# Patient Record
Sex: Male | Born: 1965 | Race: Black or African American | Hispanic: No | Marital: Married | Smoking: Former smoker
Health system: Southern US, Community
[De-identification: ages and names within clinical notes are randomized; demographics above are authoritative.]

## PROBLEM LIST (undated history)

## (undated) DIAGNOSIS — K4020 Bilateral inguinal hernia, without obstruction or gangrene, not specified as recurrent: Secondary | ICD-10-CM

## (undated) DIAGNOSIS — I10 Essential (primary) hypertension: Secondary | ICD-10-CM

---
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
## 1999-10-23 DIAGNOSIS — K4020 Bilateral inguinal hernia, without obstruction or gangrene, not specified as recurrent: Secondary | ICD-10-CM

## 1999-10-23 HISTORY — DX: Bilateral inguinal hernia, without obstruction or gangrene, not specified as recurrent: K40.20

## 2019-06-13 ENCOUNTER — Emergency Department: Payer: Self-pay

## 2019-06-13 ENCOUNTER — Emergency Department
Admission: EM | Admit: 2019-06-13 | Discharge: 2019-06-13 | Disposition: A | Discharge status: Home / Self Care | Payer: Self-pay | Attending: Student in an Organized Health Care Education/Training Program | Admitting: Student in an Organized Health Care Education/Training Program

## 2019-06-13 ENCOUNTER — Other Ambulatory Visit: Payer: Self-pay

## 2019-06-13 DIAGNOSIS — R1011 Right upper quadrant pain: Secondary | ICD-10-CM | POA: Insufficient documentation

## 2019-06-13 DIAGNOSIS — R0789 Other chest pain: Secondary | ICD-10-CM | POA: Insufficient documentation

## 2019-06-13 DIAGNOSIS — Z87891 Personal history of nicotine dependence: Secondary | ICD-10-CM | POA: Insufficient documentation

## 2019-06-13 DIAGNOSIS — R079 Chest pain, unspecified: Secondary | ICD-10-CM

## 2019-06-13 DIAGNOSIS — I10 Essential (primary) hypertension: Secondary | ICD-10-CM | POA: Insufficient documentation

## 2019-06-13 HISTORY — DX: Essential (primary) hypertension: I10

## 2019-06-13 LAB — BASIC METABOLIC PANEL
Anion gap: 13 (ref 5–15)
BUN: 14 mg/dL (ref 6–20)
CO2: 20 mmol/L — ABNORMAL LOW (ref 22–32)
Calcium: 9.1 mg/dL (ref 8.9–10.3)
Chloride: 104 mmol/L (ref 98–111)
Creatinine, Ser: 1.35 mg/dL — ABNORMAL HIGH (ref 0.61–1.24)
GFR calc Af Amer: >60 mL/min (ref >60)
GFR calc non Af Amer: >60 mL/min (ref >60)
Glucose, Bld: 108 mg/dL — ABNORMAL HIGH (ref 70–99)
Potassium: 3.1 mmol/L — ABNORMAL LOW (ref 3.5–5.1)
Sodium: 137 mmol/L (ref 135–145)

## 2019-06-13 LAB — CBC
HCT: 40.7 % (ref 39.0–52.0)
Hemoglobin: 13.5 g/dL (ref 13.0–17.0)
MCH: 27.1 pg (ref 26.0–34.0)
MCHC: 33.2 g/dL (ref 30.0–36.0)
MCV: 81.6 fL (ref 80.0–100.0)
Platelets: 277 10*3/uL (ref 150–400)
RBC: 4.99 MIL/uL (ref 4.22–5.81)
RDW: 12.8 % (ref 11.5–15.5)
WBC: 4.9 10*3/uL (ref 4.0–10.5)
nRBC: 0 % (ref 0.0–0.2)

## 2019-06-13 LAB — TROPONIN I (HIGH SENSITIVITY)
Troponin I (High Sensitivity): 7 ng/L (ref <18)
Troponin I (High Sensitivity): 8 ng/L (ref <18)

## 2019-06-13 LAB — HEPATIC FUNCTION PANEL
ALT: 19 U/L (ref 0–44)
AST: 25 U/L (ref 15–41)
Albumin: 4 g/dL (ref 3.5–5.0)
Alkaline Phosphatase: 55 U/L (ref 38–126)
Bilirubin, Direct: 0.1 mg/dL (ref 0.0–0.2)
Indirect Bilirubin: 0.9 mg/dL (ref 0.3–0.9)
Total Bilirubin: 1 mg/dL (ref 0.3–1.2)
Total Protein: 7.7 g/dL (ref 6.5–8.1)

## 2019-06-13 MED ORDER — ONDANSETRON HCL 4 MG/2ML IJ SOLN
4.0000 mg | Freq: Once | INTRAMUSCULAR | Status: AC
  Administered 2019-06-13: 17:00:00 4 mg via INTRAVENOUS
  Filled 2019-06-13: qty 2

## 2019-06-13 MED ORDER — AMLODIPINE BESYLATE 5 MG PO TABS
5.0000 mg | ORAL_TABLET | Freq: Once | ORAL | Status: AC
  Administered 2019-06-13: 19:00:00 5 mg via ORAL
  Filled 2019-06-13: qty 1

## 2019-06-13 MED ORDER — IOHEXOL 350 MG/ML SOLN
75.0000 mL | Freq: Once | INTRAVENOUS | Status: AC | PRN
  Administered 2019-06-13: 75 mL via INTRAVENOUS

## 2019-06-13 MED ORDER — SODIUM CHLORIDE 0.9% FLUSH
3.0000 mL | Freq: Once | INTRAVENOUS | Status: AC
  Administered 2019-06-13: 3 mL via INTRAVENOUS

## 2019-06-13 MED ORDER — CLONIDINE HCL 0.1 MG PO TABS
0.2000 mg | ORAL_TABLET | Freq: Once | ORAL | Status: DC
  Filled 2019-06-13: qty 2

## 2019-06-13 MED ORDER — NITROGLYCERIN 0.4 MG SL SUBL: 0.4000 mg | SUBLINGUAL_TABLET | SUBLINGUAL | Status: DC | PRN

## 2019-06-13 MED ORDER — MORPHINE SULFATE (PF) 4 MG/ML IV SOLN: 4.0000 mg | INTRAVENOUS | Status: DC | PRN

## 2019-06-13 NOTE — ED Notes (Signed)
FIRST NURSE NOTE:  Pt here via EMS with reports of chest pain, c/o weakness and chest heaviness. Per EMS pt had to be drug from the 18 wheeler.  Pt given 1 spray NTG and 324 ASA prior to arrival.

## 2019-06-13 NOTE — ED Triage Notes (Signed)
Driving down road, chest dot tight, arm stiff, weakness. Pt pulled off road reports getting SOB, dizzy, and weak. Pt states laid down at rest area and called EMS. Pt a&o x 4. Pt states pain has eased up but 6/10. Pt states still feels SOB at this time. RR 20 with even, unlabored respirations.

## 2019-06-13 NOTE — ED Notes (Signed)
Pt states he didn't take his norvasc or omeprazole today and requesting water to take them both. Pt's bp 170/100. Doctor made aware (dr Jacqualine Code)

## 2019-06-13 NOTE — Discharge Instructions (Addendum)
Follow up with your PCP and Cardiology.  Seek medical care sooner if you develop any chest pain, shortness of breath or for any additional questions or concerns.

## 2019-06-13 NOTE — ED Provider Notes (Signed)
[facility] Emergency Department Provider Note    First MD Initiated Contact with Patient 06/13/19 1557     (approximate)  I have reviewed the triage vital signs and the nursing notes.   HISTORY  Chief Complaint Chest Pain    HPI Goebel Hellums is a 53 y.o. male presents the ER for evaluation of midsternal pain and pressure that occurred right around 1:00 while he was driving.  States the pain lasted roughly 1/2 hours was associated with diaphoresis and had an episode where his left arm was cramping up with tightness.  He is never had pain like that before.  Did feel he became clammy and diaphoretic.  Was having pain with deep inspiration.  Has never had symptoms like this before.  He pulled over as he was driving for work.  EMS was called.  Was given nitro with some improvement in his symptoms.  Denies any nausea or vomiting.  No abdominal pain.  Does have a history hypertension.    Past Medical History:  Diagnosis Date  . Hernia, inguinal, bilateral 2001  . Hypertension    History reviewed. No pertinent family history. History reviewed. No pertinent surgical history. There are no active problems to display for this patient.     Prior to Admission medications   Medication Sig Start Date End Date Taking? Authorizing Provider  amLODipine (NORVASC) 5 MG tablet Take 5 mg by mouth daily.   Yes [provider]  omeprazole (PRILOSEC) 40 MG capsule Take 40 mg by mouth daily.   Yes [provider]    Allergies Patient has no allergy information on record.    Social History Social History   Tobacco Use  . Smoking status: Former Research scientist (life sciences)  . Smokeless tobacco: Never Used  Substance Use Topics  . Alcohol use: Not Currently  . Drug use: Never    Review of Systems Patient denies headaches, rhinorrhea, blurry vision, numbness, shortness of breath, chest pain, edema, cough, abdominal pain, nausea, vomiting, diarrhea, dysuria, fevers,  rashes or hallucinations unless otherwise stated above in HPI. ____________________________________________   PHYSICAL EXAM:  VITAL SIGNS: Vitals:   06/13/19 1930 06/13/19 2000  BP: (!) 159/95 (!) 162/97  Pulse: 61 67  Resp: 17 (!) 22  Temp:    SpO2: 93% 97%    Constitutional: Alert and oriented.  Eyes: Conjunctivae are normal.  Head: Atraumatic. Nose: No congestion/rhinnorhea. Mouth/Throat: Mucous membranes are moist.   Neck: No stridor. Painless ROM.  Cardiovascular: Normal rate, regular rhythm. Grossly normal heart sounds.  Good peripheral circulation. Respiratory: Normal respiratory effort.  No retractions. Lungs CTAB. Gastrointestinal: Soft and nontender. No distention. No abdominal bruits. No CVA tenderness. Genitourinary:  Musculoskeletal: No lower extremity tenderness nor edema.  No joint effusions. Neurologic:  Normal speech and language. No gross focal neurologic deficits are appreciated. No facial droop Skin:  Skin is warm, dry and intact. No rash noted. Psychiatric: Mood and affect are normal. Speech and behavior are normal.  ____________________________________________   LABS (all labs ordered are listed, but only abnormal results are displayed)  Results for orders placed or performed during the hospital encounter of 06/13/19 (from the past 24 hour(s))  Basic metabolic panel     Status: Abnormal   Collection Time: 06/13/19  2:22 PM  Result Value Ref Range   Sodium 137 135 - 145 mmol/L   Potassium 3.1 (L) 3.5 - 5.1 mmol/L   Chloride 104 98 - 111 mmol/L   CO2 20 (L) 22 - 32  mmol/L   Glucose, Bld 108 (H) 70 - 99 mg/dL   BUN 14 6 - 20 mg/dL   Creatinine, Ser 3.571.35 (H) 0.61 - 1.24 mg/dL   Calcium 9.1 8.9 - 01.710.3 mg/dL   GFR calc non Af Amer >60 >60 mL/min   GFR calc Af Amer >60 >60 mL/min   Anion gap 13 5 - 15  CBC     Status: None   Collection Time: 06/13/19  2:22 PM  Result Value Ref Range   WBC 4.9 4.0 - 10.5 K/uL   RBC 4.99 4.22 - 5.81 MIL/uL    Hemoglobin 13.5 13.0 - 17.0 g/dL   HCT 79.340.7 90.339.0 - 00.952.0 %   MCV 81.6 80.0 - 100.0 fL   MCH 27.1 26.0 - 34.0 pg   MCHC 33.2 30.0 - 36.0 g/dL   RDW 23.312.8 00.711.5 - 62.215.5 %   Platelets 277 150 - 400 K/uL   nRBC 0.0 0.0 - 0.2 %  Troponin I (High Sensitivity)     Status: None   Collection Time: 06/13/19  2:22 PM  Result Value Ref Range   Troponin I (High Sensitivity) 7 <18 ng/L  Hepatic function panel     Status: None   Collection Time: 06/13/19  2:22 PM  Result Value Ref Range   Total Protein 7.7 6.5 - 8.1 g/dL   Albumin 4.0 3.5 - 5.0 g/dL   AST 25 15 - 41 U/L   ALT 19 0 - 44 U/L   Alkaline Phosphatase 55 38 - 126 U/L   Total Bilirubin 1.0 0.3 - 1.2 mg/dL   Bilirubin, Direct 0.1 0.0 - 0.2 mg/dL   Indirect Bilirubin 0.9 0.3 - 0.9 mg/dL  Troponin I (High Sensitivity)     Status: None   Collection Time: 06/13/19  7:55 PM  Result Value Ref Range   Troponin I (High Sensitivity) 8 <18 ng/L   ____________________________________________  EKG My review and personal interpretation at Time: 14:10   Indication: chest pain  Rate: 80  Rhythm: sinus Axis: normal Other: poor r wave progression,  ____________________________________________  RADIOLOGY  I personally reviewed all radiographic images ordered to evaluate for the above acute complaints and reviewed radiology reports and findings.  These findings were personally discussed with the patient.  Please see medical record for radiology report.  ____________________________________________   PROCEDURES  Procedure(s) performed:  Procedures    Critical Care performed: no ____________________________________________   INITIAL IMPRESSION / ASSESSMENT AND PLAN / ED COURSE  Pertinent labs & imaging results that were available during my care of the patient were reviewed by me and considered in my medical decision making (see chart for details).   DDX: ACS, pericarditis, esophagitis, boerhaaves, pe, dissection, pna, bronchitis,  costochondritis    Ernestene KielCharles Dunavan is a 53 y.o. who presents to the ED with symptoms as described above.  History is concerning for cardiac etiology but is nontoxic-appearing.  Is hypertensive may be having some hypertensive urgency.  Does have some mild right upper quadrant abdominal pain.  We will also evaluate for any evidence of biliary pathology.  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  Have lower suspicion for infectious process.  Clinical Course as of Jun 12 2032  Sat Jun 13, 2019  [id number] Patient reassessed.  He is currently pain-free.  I discussed my recommendation for admission to the hospital given his risk factors for evaluation of ACS.  Patient is  with a heart score of 5.  He is declining admission to the hospital but demonstrates understanding of the risks and possible delay in diagnosis with outpatient work-up as he is not from here initially.  And states his wife is coming to pick him up and that he will be able to see his primary doctor on Monday.  He certainly seems to have capacity make these decisions.  Will order repeat troponin.     [PR]  2028 Patient reassessed and is still pain-free.  Again reiterated my recommendation for admission the hospital given his risk factors elevated blood pressure but patient is still declining this.  Based on his heart score and negative enzyme he is appropriate for outpatient follow up.  Discussed importance of following up with PCP as well as cardiology and discussed signs and symptoms for which he should seek medical care sooner.   [PR]    Clinical Course User Index [PR] Willy Eddyobinson, Zuhayr Deeney, MD    The patient was evaluated in Emergency Department today for the symptoms described in the history of present illness. He/she was evaluated in the context of the global COVID-19 pandemic, which necessitated consideration that the patient might be at risk for infection with  the SARS-CoV-2 virus that causes COVID-19. Institutional protocols and algorithms that pertain to the evaluation of patients at risk for COVID-19 are in a state of rapid change based on information released by regulatory bodies including the CDC and federal and state organizations. These policies and algorithms were followed during the patient's care in the ED.  As part of my medical decision making, I reviewed the following data within the electronic MEDICAL RECORD NUMBER Nursing notes reviewed and incorporated, Labs reviewed, notes from prior ED visits and [place] Controlled Substance Database   ____________________________________________   FINAL CLINICAL IMPRESSION(S) / ED DIAGNOSES  Final diagnoses:  RUQ abdominal pain  Chest pain, unspecified type  Hypertension, unspecified type      NEW MEDICATIONS STARTED DURING THIS VISIT:  New Prescriptions   No medications on file     Note:  This document was prepared using Dragon voice recognition software and may include unintentional dictation errors.    Willy Eddyobinson, Marquelle Balow, MD 06/13/19 2033

## 2020-10-30 IMAGING — US ULTRASOUND ABDOMEN LIMITED
1 series · 14 of 25 positions shown · non-contrast
Comparison: None.

CLINICAL DATA: Right upper quadrant pain

EXAM:
ULTRASOUND ABDOMEN LIMITED RIGHT UPPER QUADRANT

[Series 1: ultrasound abdomen limited · 14 of 37 slices shown]
[im 1/37]
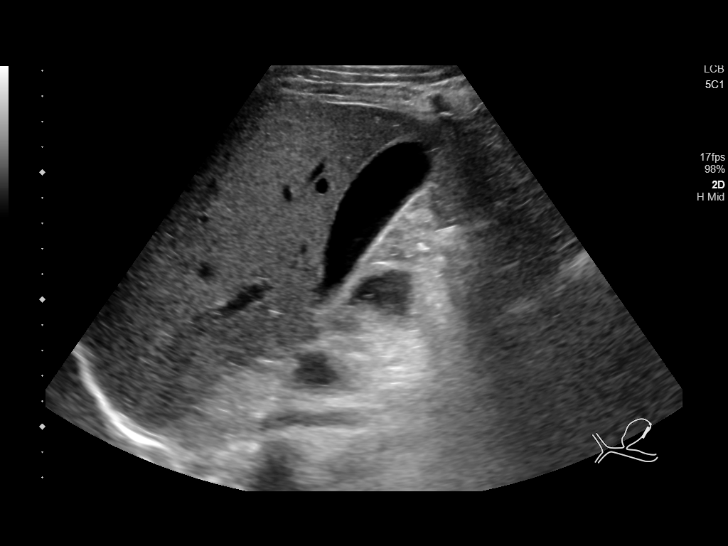
[im 4/37]
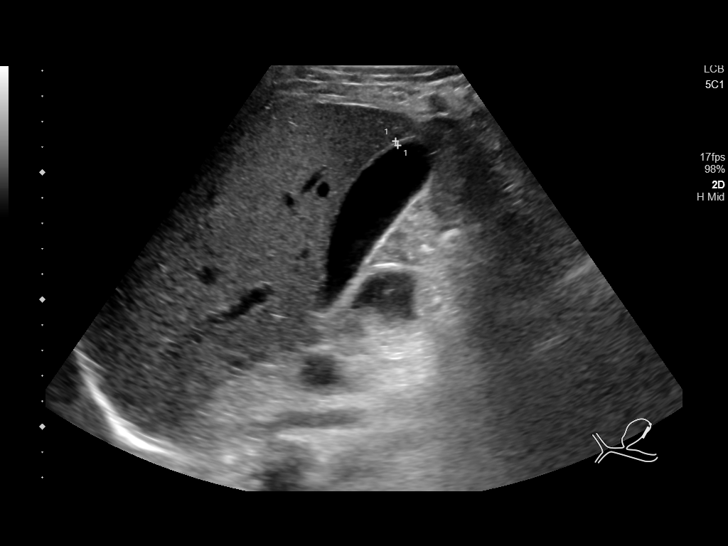
[im 7/37]
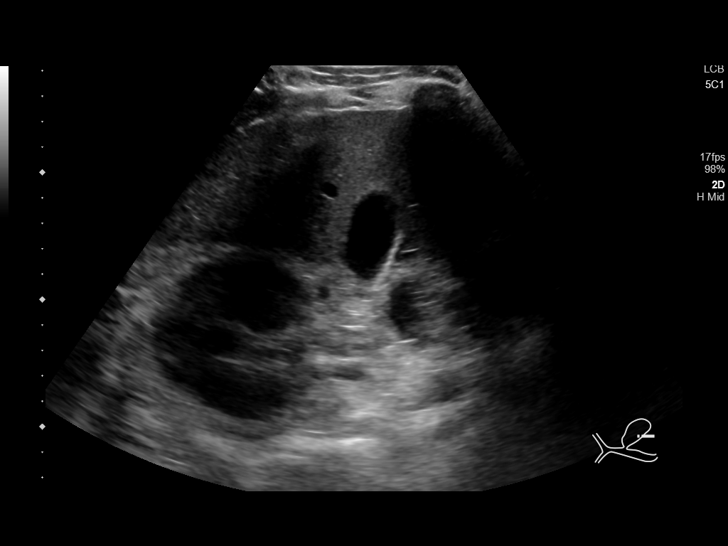
[im 10/37]
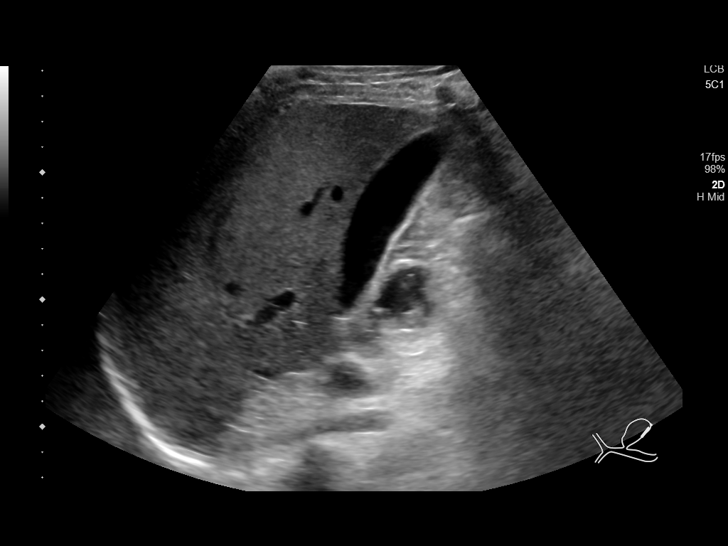
[im 13/37]
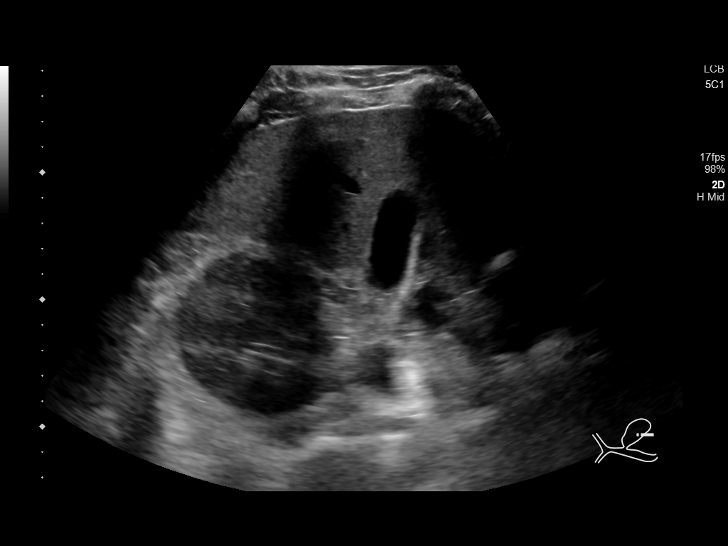
[im 14/37]
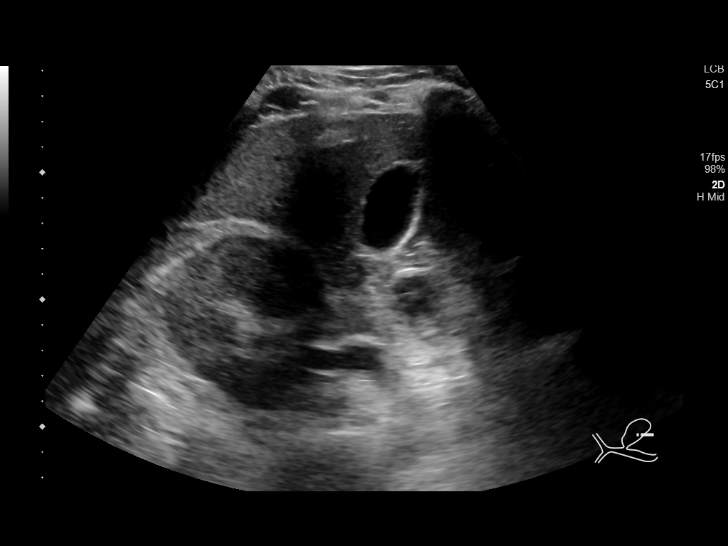
[im 17/37]
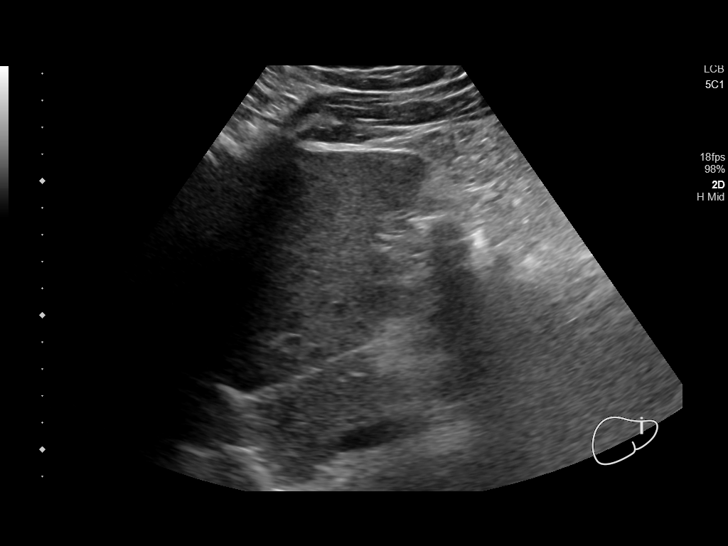
[im 20/37]
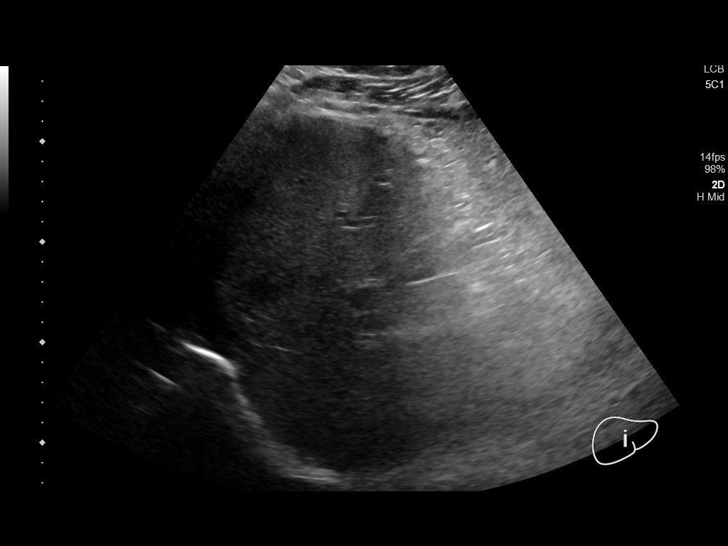
[im 23/37]
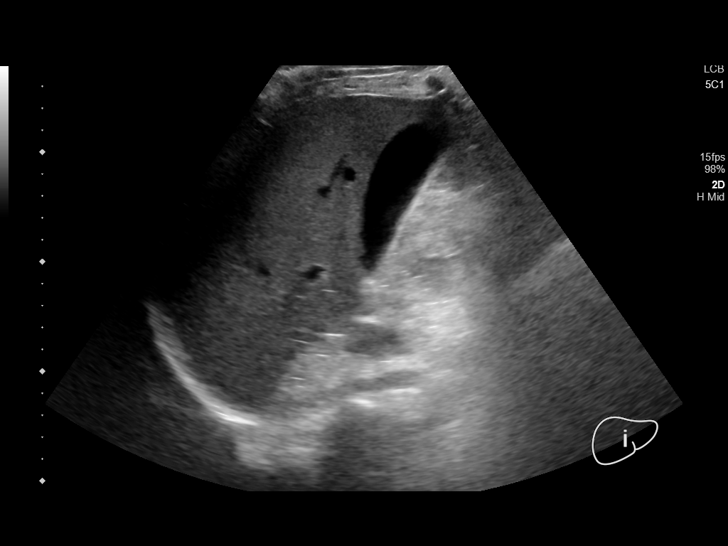
[im 25/37]
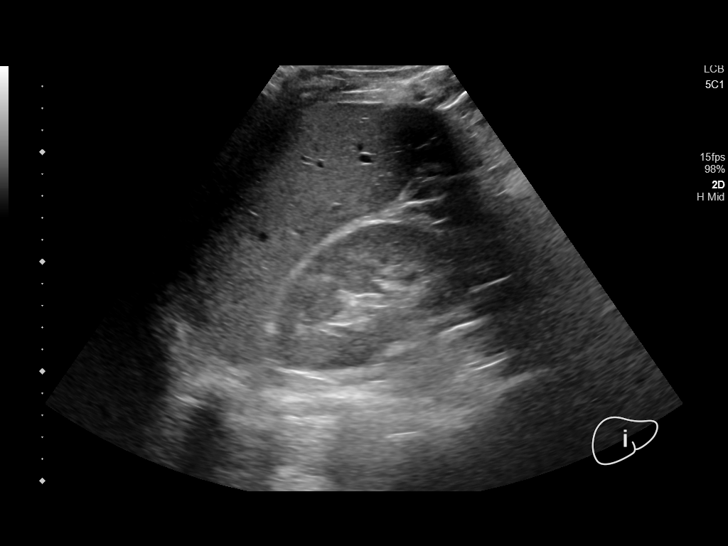
[im 28/37]
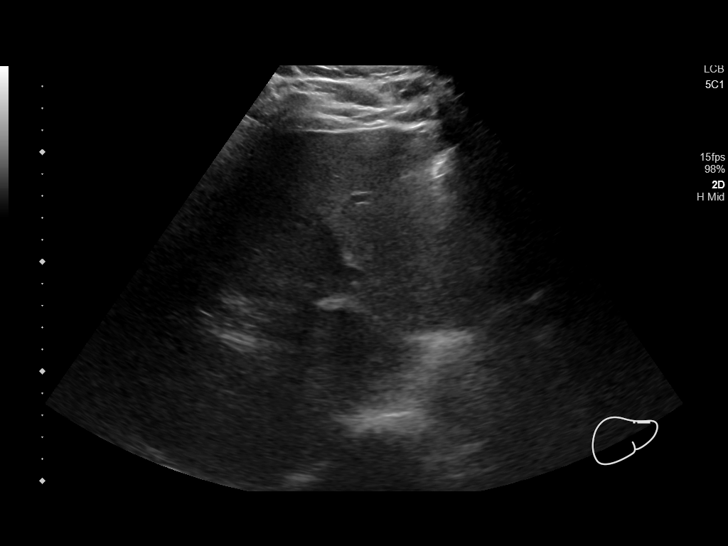
[im 31/37]
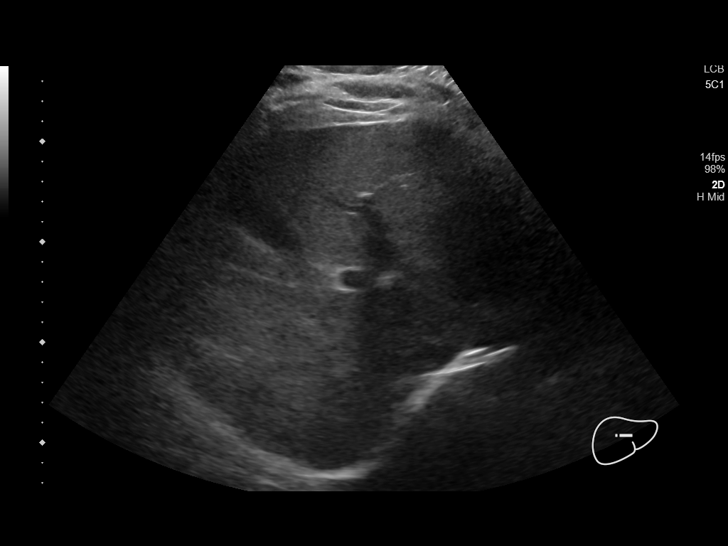
[im 34/37]
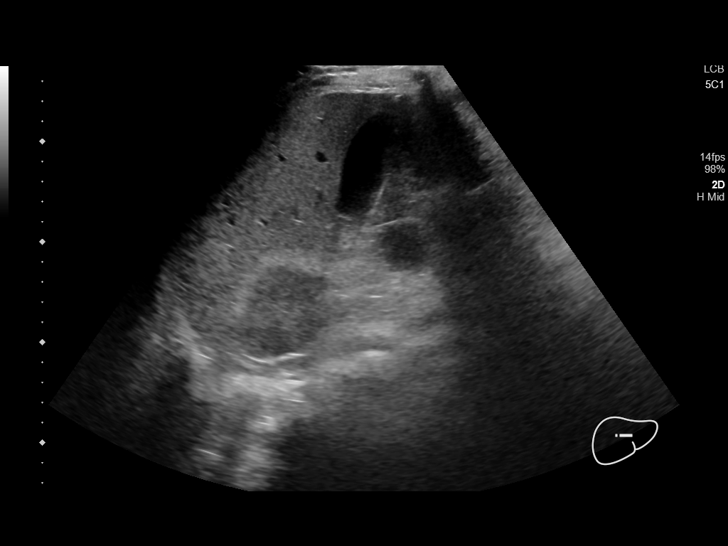
[im 37/37]
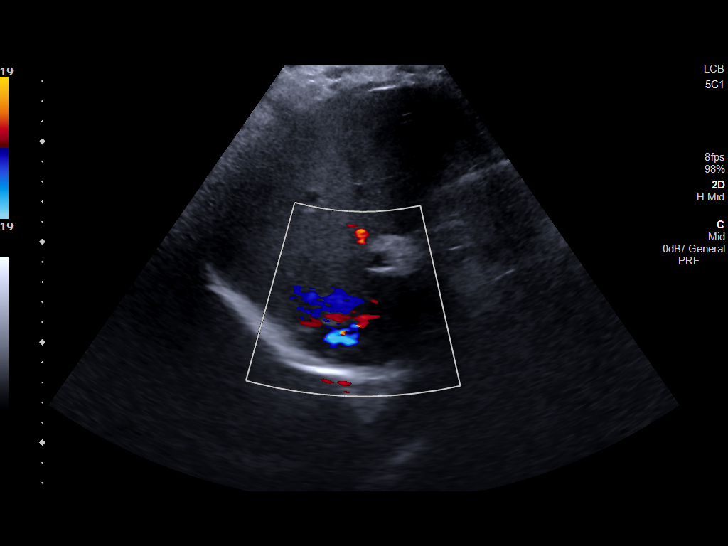

[14 of 25 positions shown; findings below may reference images not displayed]

FINDINGS: Gallbladder:

No gallstones or wall thickening visualized. No sonographic Murphy
sign noted by sonographer.

Common bile duct:

Diameter: Normal caliber, 3 mm

Liver:

No focal lesion identified. Within normal limits in parenchymal
echogenicity. Portal vein is patent on color Doppler imaging with
normal direction of blood flow towards the liver.

Other: None.
IMPRESSION: Normal right upper quadrant ultrasound.
